# Patient Record
Sex: Male | Born: 1960 | Race: Black or African American | Hispanic: No | Smoking: Never smoker
Health system: Southern US, Community
[De-identification: ages and names within clinical notes are randomized; demographics above are authoritative.]

## PROBLEM LIST (undated history)

## (undated) DIAGNOSIS — E119 Type 2 diabetes mellitus without complications: Secondary | ICD-10-CM

## (undated) DIAGNOSIS — I10 Essential (primary) hypertension: Secondary | ICD-10-CM

---

## 2000-05-01 ENCOUNTER — Encounter: Admission: RE | Admit: 2000-05-01 | Discharge: 2000-07-30 | Payer: Self-pay | Admitting: Endocrinology

## 2010-02-20 ENCOUNTER — Observation Stay (HOSPITAL_COMMUNITY): Admission: EM | Admit: 2010-02-20 | Discharge: 2010-02-21 | Payer: Self-pay | Admitting: Emergency Medicine

## 2010-12-06 LAB — BASIC METABOLIC PANEL
BUN: 14 mg/dL (ref 6–23)
Calcium: 9.2 mg/dL (ref 8.4–10.5)
Creatinine, Ser: 1.25 mg/dL (ref 0.4–1.5)
GFR calc Af Amer: >60 mL/min (ref >60)
Glucose, Bld: 47 mg/dL — ABNORMAL LOW (ref 70–99)
Potassium: 3.3 mEq/L — ABNORMAL LOW (ref 3.5–5.1)

## 2010-12-06 LAB — GLUCOSE, CAPILLARY
Glucose-Capillary: 268 mg/dL — ABNORMAL HIGH (ref 70–99)
Glucose-Capillary: 90 mg/dL (ref 70–99)

## 2010-12-06 LAB — CBC
Hemoglobin: 11.5 g/dL — ABNORMAL LOW (ref 13.0–17.0)
Hemoglobin: 15.3 g/dL (ref 13.0–17.0)
MCHC: 33.5 g/dL (ref 30.0–36.0)
MCHC: 34 g/dL (ref 30.0–36.0)
MCV: 88.2 fL (ref 78.0–100.0)
MCV: 88.9 fL (ref 78.0–100.0)
Platelets: 161 10*3/uL (ref 150–400)
Platelets: 228 10*3/uL (ref 150–400)
RBC: 3.87 MIL/uL — ABNORMAL LOW (ref 4.22–5.81)
RDW: 12.7 % (ref 11.5–15.5)
RDW: 12.9 % (ref 11.5–15.5)

## 2010-12-06 LAB — DIFFERENTIAL
Basophils Relative: 0 % (ref 0–1)
Lymphs Abs: 0.8 10*3/uL (ref 0.7–4.0)
Monocytes Absolute: 0.8 10*3/uL (ref 0.1–1.0)
Monocytes Relative: 6 % (ref 3–12)
Neutrophils Relative %: 87 % — ABNORMAL HIGH (ref 43–77)

## 2010-12-06 LAB — POCT I-STAT GLUCOSE: Operator id: 146111

## 2014-12-17 ENCOUNTER — Other Ambulatory Visit: Payer: Self-pay | Admitting: Internal Medicine

## 2014-12-17 DIAGNOSIS — M25512 Pain in left shoulder: Secondary | ICD-10-CM

## 2015-01-02 ENCOUNTER — Ambulatory Visit
Admission: RE | Admit: 2015-01-02 | Discharge: 2015-01-02 | Disposition: A | Discharge status: Home / Self Care | Payer: BLUE CROSS/BLUE SHIELD | Source: Ambulatory Visit | Attending: Internal Medicine | Admitting: Internal Medicine

## 2015-01-02 DIAGNOSIS — M25512 Pain in left shoulder: Secondary | ICD-10-CM

## 2016-02-25 ENCOUNTER — Emergency Department (HOSPITAL_COMMUNITY)
Admission: EM | Admit: 2016-02-25 | Discharge: 2016-02-25 | Disposition: A | Discharge status: Home / Self Care | Payer: No Typology Code available for payment source | Attending: Emergency Medicine | Admitting: Emergency Medicine

## 2016-02-25 ENCOUNTER — Emergency Department (HOSPITAL_COMMUNITY): Payer: No Typology Code available for payment source

## 2016-02-25 ENCOUNTER — Encounter (HOSPITAL_COMMUNITY): Payer: Self-pay | Admitting: *Deleted

## 2016-02-25 DIAGNOSIS — I10 Essential (primary) hypertension: Secondary | ICD-10-CM | POA: Insufficient documentation

## 2016-02-25 DIAGNOSIS — Z23 Encounter for immunization: Secondary | ICD-10-CM | POA: Diagnosis not present

## 2016-02-25 DIAGNOSIS — S80212A Abrasion, left knee, initial encounter: Secondary | ICD-10-CM | POA: Diagnosis not present

## 2016-02-25 DIAGNOSIS — S2220XA Unspecified fracture of sternum, initial encounter for closed fracture: Secondary | ICD-10-CM | POA: Insufficient documentation

## 2016-02-25 DIAGNOSIS — Y999 Unspecified external cause status: Secondary | ICD-10-CM | POA: Insufficient documentation

## 2016-02-25 DIAGNOSIS — E119 Type 2 diabetes mellitus without complications: Secondary | ICD-10-CM | POA: Diagnosis not present

## 2016-02-25 DIAGNOSIS — Y92410 Unspecified street and highway as the place of occurrence of the external cause: Secondary | ICD-10-CM | POA: Diagnosis not present

## 2016-02-25 DIAGNOSIS — R079 Chest pain, unspecified: Secondary | ICD-10-CM | POA: Diagnosis present

## 2016-02-25 DIAGNOSIS — Y939 Activity, unspecified: Secondary | ICD-10-CM | POA: Diagnosis not present

## 2016-02-25 DIAGNOSIS — S60812A Abrasion of left wrist, initial encounter: Secondary | ICD-10-CM | POA: Insufficient documentation

## 2016-02-25 DIAGNOSIS — S80211A Abrasion, right knee, initial encounter: Secondary | ICD-10-CM | POA: Diagnosis not present

## 2016-02-25 HISTORY — DX: Essential (primary) hypertension: I10

## 2016-02-25 HISTORY — DX: Type 2 diabetes mellitus without complications: E11.9

## 2016-02-25 LAB — I-STAT CHEM 8, ED
BUN: 19 mg/dL (ref 6–20)
CALCIUM ION: 1.11 mmol/L — AB (ref 1.12–1.23)
CHLORIDE: 103 mmol/L (ref 101–111)
Creatinine, Ser: 1 mg/dL (ref 0.61–1.24)
GLUCOSE: 120 mg/dL — AB (ref 65–99)
HCT: 48 % (ref 39.0–52.0)
HEMOGLOBIN: 16.3 g/dL (ref 13.0–17.0)
POTASSIUM: 3.7 mmol/L (ref 3.5–5.1)
SODIUM: 141 mmol/L (ref 135–145)
TCO2: 28 mmol/L (ref 0–100)

## 2016-02-25 LAB — I-STAT TROPONIN, ED: TROPONIN I, POC: 0 ng/mL (ref 0.00–0.08)

## 2016-02-25 LAB — CBG MONITORING, ED: GLUCOSE-CAPILLARY: 169 mg/dL — AB (ref 65–99)

## 2016-02-25 MED ORDER — MORPHINE SULFATE (PF) 4 MG/ML IV SOLN
6.0000 mg | Freq: Once | INTRAVENOUS | Status: AC
  Administered 2016-02-25: 6 mg via INTRAMUSCULAR
  Filled 2016-02-25: qty 2

## 2016-02-25 MED ORDER — ONDANSETRON 4 MG PO TBDP: 4.0000 mg | ORAL_TABLET | Freq: Three times a day (TID) | ORAL | Status: AC | PRN

## 2016-02-25 MED ORDER — OXYCODONE-ACETAMINOPHEN 5-325 MG PO TABS: 1.0000 | ORAL_TABLET | Freq: Four times a day (QID) | ORAL | Status: AC | PRN

## 2016-02-25 MED ORDER — DOCUSATE SODIUM 100 MG PO CAPS
100.0000 mg | ORAL_CAPSULE | Freq: Two times a day (BID) | ORAL | Status: AC
Start: 2016-02-25 — End: ?

## 2016-02-25 MED ORDER — ONDANSETRON 4 MG PO TBDP
4.0000 mg | ORAL_TABLET | Freq: Once | ORAL | Status: AC
Start: 2016-02-25 — End: 2016-02-25
  Administered 2016-02-25: 4 mg via ORAL
  Filled 2016-02-25: qty 1

## 2016-02-25 MED ORDER — IOPAMIDOL (ISOVUE-300) INJECTION 61%
INTRAVENOUS | Status: AC
  Administered 2016-02-25: 75 mL
  Filled 2016-02-25: qty 100

## 2016-02-25 MED ORDER — TETANUS-DIPHTH-ACELL PERTUSSIS 5-2.5-18.5 LF-MCG/0.5 IM SUSP
0.5000 mL | Freq: Once | INTRAMUSCULAR | Status: AC
  Administered 2016-02-25: 0.5 mL via INTRAMUSCULAR
  Filled 2016-02-25: qty 0.5
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
## 2016-02-25 NOTE — ED Notes (Signed)
Pt taken to CT.

## 2016-02-25 NOTE — ED Notes (Signed)
Pt taken to xray 
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
## 2019-04-10 ENCOUNTER — Telehealth (HOSPITAL_COMMUNITY): Payer: Self-pay

## 2019-04-10 ENCOUNTER — Encounter (HOSPITAL_COMMUNITY): Payer: Self-pay

## 2019-04-10 ENCOUNTER — Ambulatory Visit (HOSPITAL_COMMUNITY)
Admission: EM | Admit: 2019-04-10 | Discharge: 2019-04-10 | Disposition: A | Discharge status: Home / Self Care | Payer: Self-pay

## 2019-04-10 ENCOUNTER — Telehealth: Payer: Self-pay | Admitting: Emergency Medicine

## 2019-04-10 ENCOUNTER — Other Ambulatory Visit: Payer: Self-pay

## 2019-04-10 DIAGNOSIS — S7012XA Contusion of left thigh, initial encounter: Secondary | ICD-10-CM | POA: Insufficient documentation

## 2019-04-10 DIAGNOSIS — I10 Essential (primary) hypertension: Secondary | ICD-10-CM

## 2019-04-10 LAB — COMPREHENSIVE METABOLIC PANEL
ALT: 33 U/L (ref 0–44)
AST: 27 U/L (ref 15–41)
Albumin: 3.9 g/dL (ref 3.5–5.0)
Alkaline Phosphatase: 85 U/L (ref 38–126)
Anion gap: 7 (ref 5–15)
BUN: 12 mg/dL (ref 6–20)
CO2: 28 mmol/L (ref 22–32)
Calcium: 8.9 mg/dL (ref 8.9–10.3)
Chloride: 109 mmol/L (ref 98–111)
Creatinine, Ser: 1.07 mg/dL (ref 0.61–1.24)
GFR calc Af Amer: >60 mL/min (ref >60)
GFR calc non Af Amer: >60 mL/min (ref >60)
Glucose, Bld: 43 mg/dL — CL (ref 70–99)
Potassium: 3.5 mmol/L (ref 3.5–5.1)
Sodium: 144 mmol/L (ref 135–145)
Total Bilirubin: 0.7 mg/dL (ref 0.3–1.2)
Total Protein: 7.2 g/dL (ref 6.5–8.1)

## 2019-04-10 LAB — CBC
HCT: 47.6 % (ref 39.0–52.0)
Hemoglobin: 15.1 g/dL (ref 13.0–17.0)
MCH: 28.3 pg (ref 26.0–34.0)
MCHC: 31.7 g/dL (ref 30.0–36.0)
MCV: 89.3 fL (ref 80.0–100.0)
Platelets: 272 10*3/uL (ref 150–400)
RBC: 5.33 MIL/uL (ref 4.22–5.81)
RDW: 13.2 % (ref 11.5–15.5)
WBC: 8.2 10*3/uL (ref 4.0–10.5)
nRBC: 0 % (ref 0.0–0.2)

## 2019-04-10 NOTE — Telephone Encounter (Signed)
Office attempted to reach patient x3 today about low glucose level, I also attempted to reach patietn and left vm. Will try to reach patient again tomorrw
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
## 2019-04-10 NOTE — Discharge Instructions (Signed)
Use ice as discussed at appointment. Do not take any NSAIDs (Motrin, ibuprofen, Aleve, naproxen, BC powders) or aspirin. We will call you with your lab results if abnormal. To ER for further evaluation if you develop leg numbness, swelling, redness, abdominal or chest pain, difficulty breathing, lightheadedness.

## 2019-04-10 NOTE — Telephone Encounter (Signed)
Patient returning call.  Relayed lab results: glucose at critical low of 43.  CBC unremarkable, kidney liver function within normal limits.  Patient states that he has eaten since discharge.  Had a granola bar in the car.  Maintains that he was asymptomatic during visit, is feeling well currently.  Has not had taken insulin today, and will monitor glucose.  Patient has 8/5 appointment with PCP, encouraged him to bring up hypoglycemic reading at that time.  Return precautions discussed, patient verbalized understanding and is agreeable to plan.

## 2019-04-10 NOTE — ED Triage Notes (Signed)
Patient presents to Urgent Care with complaints of large area of bruising to left upper thigh since 2 days ago. Patient reports no known injury, is not on blood thinners.

## 2023-11-22 ENCOUNTER — Emergency Department (HOSPITAL_COMMUNITY)

## 2023-11-22 ENCOUNTER — Emergency Department (HOSPITAL_COMMUNITY)
Admission: EM | Admit: 2023-11-22 | Discharge: 2023-11-22 | Disposition: A | Discharge status: Home / Self Care | Attending: Emergency Medicine | Admitting: Emergency Medicine

## 2023-11-22 DIAGNOSIS — R270 Ataxia, unspecified: Secondary | ICD-10-CM | POA: Diagnosis not present

## 2023-11-22 DIAGNOSIS — Z794 Long term (current) use of insulin: Secondary | ICD-10-CM | POA: Insufficient documentation

## 2023-11-22 DIAGNOSIS — R4701 Aphasia: Secondary | ICD-10-CM

## 2023-11-22 DIAGNOSIS — E119 Type 2 diabetes mellitus without complications: Secondary | ICD-10-CM | POA: Insufficient documentation

## 2023-11-22 DIAGNOSIS — R4781 Slurred speech: Secondary | ICD-10-CM | POA: Diagnosis present

## 2023-11-22 DIAGNOSIS — F10120 Alcohol abuse with intoxication, uncomplicated: Secondary | ICD-10-CM | POA: Diagnosis not present

## 2023-11-22 DIAGNOSIS — F10920 Alcohol use, unspecified with intoxication, uncomplicated: Secondary | ICD-10-CM | POA: Diagnosis not present

## 2023-11-22 LAB — RAPID URINE DRUG SCREEN, HOSP PERFORMED
Amphetamines: NOT DETECTED
Barbiturates: NOT DETECTED
Benzodiazepines: NOT DETECTED
Cocaine: NOT DETECTED
Opiates: NOT DETECTED
Tetrahydrocannabinol: NOT DETECTED

## 2023-11-22 LAB — CBC
HCT: 41.8 % (ref 39.0–52.0)
Hemoglobin: 13.4 g/dL (ref 13.0–17.0)
MCH: 29.1 pg (ref 26.0–34.0)
MCHC: 32.1 g/dL (ref 30.0–36.0)
MCV: 90.9 fL (ref 80.0–100.0)
Platelets: 230 10*3/uL (ref 150–400)
RBC: 4.6 MIL/uL (ref 4.22–5.81)
RDW: 13.6 % (ref 11.5–15.5)
WBC: 6 10*3/uL (ref 4.0–10.5)
nRBC: 0 % (ref 0.0–0.2)

## 2023-11-22 LAB — COMPREHENSIVE METABOLIC PANEL
ALT: 33 U/L (ref 0–44)
AST: 32 U/L (ref 15–41)
Albumin: 3.4 g/dL — ABNORMAL LOW (ref 3.5–5.0)
Alkaline Phosphatase: 56 U/L (ref 38–126)
Anion gap: 13 (ref 5–15)
BUN: 15 mg/dL (ref 8–23)
CO2: 25 mmol/L (ref 22–32)
Calcium: 9.1 mg/dL (ref 8.9–10.3)
Chloride: 99 mmol/L (ref 98–111)
Creatinine, Ser: 1.27 mg/dL — ABNORMAL HIGH (ref 0.61–1.24)
GFR, Estimated: >60 mL/min (ref >60)
Glucose, Bld: 371 mg/dL — ABNORMAL HIGH (ref 70–99)
Potassium: 3.9 mmol/L (ref 3.5–5.1)
Sodium: 137 mmol/L (ref 135–145)
Total Bilirubin: 0.5 mg/dL (ref 0.0–1.2)
Total Protein: 6.4 g/dL — ABNORMAL LOW (ref 6.5–8.1)

## 2023-11-22 LAB — I-STAT CHEM 8, ED
BUN: 16 mg/dL (ref 8–23)
Calcium, Ion: 1.09 mmol/L — ABNORMAL LOW (ref 1.15–1.40)
Chloride: 101 mmol/L (ref 98–111)
Creatinine, Ser: 1.5 mg/dL — ABNORMAL HIGH (ref 0.61–1.24)
Glucose, Bld: 357 mg/dL — ABNORMAL HIGH (ref 70–99)
HCT: 44 % (ref 39.0–52.0)
Hemoglobin: 15 g/dL (ref 13.0–17.0)
Potassium: 3.9 mmol/L (ref 3.5–5.1)
Sodium: 137 mmol/L (ref 135–145)
TCO2: 27 mmol/L (ref 22–32)

## 2023-11-22 LAB — I-STAT VENOUS BLOOD GAS, ED
Acid-Base Excess: 3 mmol/L — ABNORMAL HIGH (ref 0.0–2.0)
Bicarbonate: 27.5 mmol/L (ref 20.0–28.0)
Calcium, Ion: 1.03 mmol/L — ABNORMAL LOW (ref 1.15–1.40)
HCT: 43 % (ref 39.0–52.0)
Hemoglobin: 14.6 g/dL (ref 13.0–17.0)
O2 Saturation: 100 %
Potassium: 5.5 mmol/L — ABNORMAL HIGH (ref 3.5–5.1)
Sodium: 137 mmol/L (ref 135–145)
TCO2: 29 mmol/L (ref 22–32)
pCO2, Ven: 41.2 mmHg — ABNORMAL LOW (ref 44–60)
pH, Ven: 7.433 — ABNORMAL HIGH (ref 7.25–7.43)
pO2, Ven: 164 mmHg — ABNORMAL HIGH (ref 32–45)

## 2023-11-22 LAB — URINALYSIS, ROUTINE W REFLEX MICROSCOPIC
Bacteria, UA: NONE SEEN
Bilirubin Urine: NEGATIVE
Glucose, UA: >=500 mg/dL — AB
Hgb urine dipstick: NEGATIVE
Ketones, ur: NEGATIVE mg/dL
Leukocytes,Ua: NEGATIVE
Nitrite: NEGATIVE
Protein, ur: NEGATIVE mg/dL
Specific Gravity, Urine: 1.013 (ref 1.005–1.030)
pH: 5 (ref 5.0–8.0)

## 2023-11-22 LAB — DIFFERENTIAL
Abs Immature Granulocytes: 0.01 10*3/uL (ref 0.00–0.07)
Basophils Absolute: 0 10*3/uL (ref 0.0–0.1)
Basophils Relative: 1 %
Eosinophils Absolute: 0.1 10*3/uL (ref 0.0–0.5)
Eosinophils Relative: 2 %
Immature Granulocytes: 0 %
Lymphocytes Relative: 34 %
Lymphs Abs: 2 10*3/uL (ref 0.7–4.0)
Monocytes Absolute: 0.9 10*3/uL (ref 0.1–1.0)
Monocytes Relative: 15 %
Neutro Abs: 2.9 10*3/uL (ref 1.7–7.7)
Neutrophils Relative %: 48 %

## 2023-11-22 LAB — CBG MONITORING, ED: Glucose-Capillary: 355 mg/dL — ABNORMAL HIGH (ref 70–99)

## 2023-11-22 LAB — AMMONIA: Ammonia: 65 umol/L — ABNORMAL HIGH (ref 9–35)

## 2023-11-22 LAB — I-STAT CG4 LACTIC ACID, ED: Lactic Acid, Venous: 2.2 mmol/L (ref 0.5–1.9)

## 2023-11-22 LAB — ETHANOL: Alcohol, Ethyl (B): 276 mg/dL — ABNORMAL HIGH (ref <10)

## 2023-11-22 LAB — HEPARIN LEVEL (UNFRACTIONATED): Heparin Unfractionated: <0.1 [IU]/mL — ABNORMAL LOW (ref 0.30–0.70)

## 2023-11-22 LAB — PROTIME-INR
INR: 0.9 (ref 0.8–1.2)
Prothrombin Time: 12.8 s (ref 11.4–15.2)

## 2023-11-22 LAB — APTT: aPTT: 28 s (ref 24–36)

## 2023-11-22 NOTE — Code Documentation (Signed)
 Responded to Code Stroke called at 0018 for slurred speech and abnormal gait, [id number]. Pt arrived at 0024, [id number], NIH-4 for ataxia, aphasia, and slurred speech. CT head negative for acute changes. TNK not given-risks outweigh benefit. Pt transported to MRI at 0106. MRI-negative for stroke. Code Stroke cancelled at 0154.

## 2023-11-22 NOTE — Consult Note (Signed)
 NEUROLOGY CONSULT NOTE   Date of service: November 22, 2023 Patient Name: Logan Mack MRN:  [id number] DOB:  [birth year] Chief Complaint: Abnormal speech and unsteady gait Requesting Provider: Arletha Pili, DO  History of Present Illness  Logan Mack is a 63 y.o. male with a PMHx of regular EtOH use, DM and HTN who is brought in by EMS as a Code Stroke after his wife noted him at work to have slurred speech, difficulty ambulating and garbled speech. LKN was 2030. He reported having "2 margaritas" tonight. CBG was 355 on arrival to the ED.   LKW: 2030 Modified rankin score: 0-Completely asymptomatic and back to baseline post- stroke IV Thrombolysis:  No: Presentation is most consistent with EtOH intoxication EVT: No:  Presentation is most consistent with EtOH intoxication   NIHSS components Score: Comment  1a Level of Conscious 0[x]  1[]  2[]  3[]      1b LOC Questions 0[x]  1[]  2[]       1c LOC Commands 0[x]  1[]  2[]       2 Best Gaze 0[x]  1[]  2[]       3 Visual 0[x]  1[]  2[]  3[]      4 Facial Palsy 0[x]  1[]  2[]  3[]      5a Motor Arm - left 0[x]  1[]  2[]  3[]  4[]  UN[]    5b Motor Arm - Right 0[x]  1[]  2[]  3[]  4[]  UN[]    6a Motor Leg - Left 0[x]  1[]  2[]  3[]  4[]  UN[]    6b Motor Leg - Right 0[x]  1[]  2[]  3[]  4[]  UN[]    7 Limb Ataxia 0[]  1[]  2[x]  3[]  UN[]     8 Sensory 0[x]  1[]  2[]  UN[]      9 Best Language 0[]  1[x]  2[]  3[]      10 Dysarthria 0[]  1[x]  2[]  UN[]      11 Extinct. and Inattention 0[x]  1[]  2[]       TOTAL:   4      ROS  Detailed ROS deferred in the context of acuity of presentation.     Past History   Past Medical History:  Diagnosis Date   Diabetes mellitus without complication (HCC)    Hypertension     No past surgical history on file.  Family History: Family History  Problem Relation Age of Onset   Cancer Mother    Diabetes Father     Social History  reports that he has never smoked. He has never used smokeless tobacco. He reports current alcohol use. No history  on file for drug use.  No Known Allergies  Medications  No current facility-administered medications for this encounter.  Current Outpatient Medications:    docusate sodium (COLACE) 100 MG capsule, Take 1 capsule (100 mg total) by mouth every 12 (twelve) hours., Disp: 60 capsule, Rfl: 0   insulin glargine (LANTUS) 100 UNIT/ML injection, Inject into the skin daily., Disp: , Rfl:    ondansetron (ZOFRAN ODT) 4 MG disintegrating tablet, Take 1 tablet (4 mg total) by mouth every 8 (eight) hours as needed for nausea or vomiting., Disp: 20 tablet, Rfl: 0   oxyCODONE-acetaminophen (PERCOCET/ROXICET) 5-325 MG tablet, Take 1-2 tablets by mouth every 6 (six) hours as needed., Disp: 30 tablet, Rfl: 0  Vitals  There were no vitals filed for this visit.  There is no height or weight on file to calculate BMI.  Physical Exam   Physical Exam  HEENT-  [place]/AT    Lungs- Respirations unlabored Extremities- No edema  Neurological Examination Mental Status: Alert, oriented x 5, but with confusion as evidenced by replies to complex  questions and responses to complex commands.  Speech is with a stuttering quality with difficulty articulating simple ideas, but no definite errors of grammar or syntax and no paraphasias.  Can follow all simple commands and answer basic questions, but becomes confused with more challenging tasks.  Cranial Nerves: II: Temporal visual fields intact with no extinction to DSS. Pupils 6 mm and briskly reactive bilaterally  III,IV, VI: No ptosis. EOMI.  V: Temp sensation equal bilaterally  VII: Smile symmetric VIII: Hearing intact to voice IX,X: No hoarseness XI: Symmetric shoulder shrug XII: Midline tongue extension Motor: BUE 5/5 proximally and distally BLE 5/5 proximally and distally  No pronator drift.  Sensory: Temp and light touch intact throughout, bilaterally. No extinction to DSS.  Deep Tendon Reflexes: 2+ and symmetric throughout Cerebellar: Mild ataxia with FNF  bilaterally. Moderate ataxia with H-S bilaterally.  Gait: Wide-based, ataxic gait.   Labs/Imaging/Neurodiagnostic studies   CBC: No results for input(s): "WBC", "NEUTROABS", "HGB", "HCT", "MCV", "PLT" in the last 168 hours. Basic Metabolic Panel:  Lab Results  Component Value Date   NA 144 04/10/2019   K 3.5 04/10/2019   CO2 28 04/10/2019   GLUCOSE 43 (LL) 04/10/2019   BUN 12 04/10/2019   CREATININE 1.07 04/10/2019   CALCIUM 8.9 04/10/2019   GFRNONAA >60 04/10/2019   GFRAA >60 04/10/2019   Lipid Panel: No results found for: "LDLCALC" HgbA1c: No results found for: "HGBA1C" Urine Drug Screen: No results found for: "LABOPIA", "COCAINSCRNUR", "LABBENZ", "AMPHETMU", "THCU", "LABBARB"  Alcohol Level No results found for: "ETH" INR  Lab Results  Component Value Date   INR 1.12 02/21/2010     ASSESSMENT  63 y.o. male with a PMHx of regular EtOH use, DM and HTN who is brought in by EMS as a Code Stroke after his wife noted him at work to have slurred speech, difficulty ambulating and garbled speech. LKN was 2030. He reported having "2 margaritas" tonight. CBG was 355 on arrival to the ED.  - Exam reveals findings suggestive of EtOH intoxication. His speech pattern is dysphasic, but not typical of stroke, more consistent with intoxication.  - CT head: No evidence of acute intracranial abnormality. ASPECTS is 10.  - Labs: - Glucose 355 - EtOH - Ammonia elevated at 65. AST and ALT are normal.  - BUN 15 and Cr 1.27 - Lactate 2.2 - WBC normal - UDS negative - EtOH level significantly elevated at 276 - Obtaining a STAT MRI brain to fully rule out an acute stroke  RECOMMENDATIONS  - MRI brain - Frequent neuro checks  Addendum: MRI brain: No evidence of acute intracranial abnormality.  ______________________________________________________________________    Dessa Phi, Ashlee Bewley, MD [facility]

## 2023-11-22 NOTE — Discharge Instructions (Addendum)
 You may take your insulin when you return home.  While you were in the emergency room, you had scans done of your head that were normal.  Your alcohol level was elevated, 269.  This correlates to a 0.269 on a breathalyzer.  Your ammonia, which can sometimes be elevated due to alcohol use, was elevated.  Make sure that you are drinking plenty of water over the next few days, and follow-up with your primary care doctor within 1 week.

## 2023-11-22 NOTE — ED Provider Notes (Addendum)
 [facility] EM[facility]MENT AT [facility] Provider Note   CSN: [id number] Arrival date & time: 11/22/23  4098     History  No chief complaint on file.   Logan Mack is a 63 y.o. male.  This is a 63 year old male who presents to the emergency room today due to slurred speech, balance issues.  Patient has history of diabetes, takes insulin.  Does endorse consuming alcohol this evening.  Patient was activated as a code stroke from the field.        Home Medications Prior to Admission medications   Medication Sig Start Date End Date Taking? Authorizing Provider  docusate sodium (COLACE) 100 MG capsule Take 1 capsule (100 mg total) by mouth every 12 (twelve) hours. 02/25/16   Ward, Layla Maw, DO  insulin glargine (LANTUS) 100 UNIT/ML injection Inject into the skin daily.    [provider]  ondansetron (ZOFRAN ODT) 4 MG disintegrating tablet Take 1 tablet (4 mg total) by mouth every 8 (eight) hours as needed for nausea or vomiting. 02/25/16   Ward, Layla Maw, DO  oxyCODONE-acetaminophen (PERCOCET/ROXICET) 5-325 MG tablet Take 1-2 tablets by mouth every 6 (six) hours as needed. 02/25/16   Ward, Layla Maw, DO      Allergies    Patient has no known allergies.    Review of Systems   Review of Systems  Physical Exam Updated Vital Signs There were no vitals taken for this visit. Physical Exam Vitals reviewed.  HENT:     Mouth/Throat:     Mouth: Mucous membranes are moist.  Cardiovascular:     Rate and Rhythm: Normal rate.  Pulmonary:     Effort: Pulmonary effort is normal.  Abdominal:     General: Abdomen is flat.     Palpations: Abdomen is soft.  Musculoskeletal:        General: Normal range of motion.  Neurological:     Mental Status: He is alert.     Cranial Nerves: No cranial nerve deficit.     Comments: Patient appears of some word finding difficulty.  He has no weakness     ED Results / Procedures / Treatments   Labs (all labs ordered are  listed, but only abnormal results are displayed) Labs Reviewed  CBG MONITORING, ED - Abnormal; Notable for the following components:      Result Value   Glucose-Capillary 355 (*)    All other components within normal limits  ETHANOL  PROTIME-INR  APTT  CBC  DIFFERENTIAL  COMPREHENSIVE METABOLIC PANEL  RAPID URINE DRUG SCREEN, HOSP PERFORMED  URINALYSIS, ROUTINE W REFLEX MICROSCOPIC  I-STAT CHEM 8, ED    EKG None  Radiology No results found.  Procedures Procedures    Medications Ordered in ED Medications - No data to display  ED Course/ Medical Decision Making/ A&P                                 Medical Decision Making 63 year old male here today for dizziness and slurred speech.  Plan-patient activated as a code stroke on arrival, went to CT scan.  Upon initial the patient, did seem intoxicated, and his alcohol did come back up to 76.  Abdomen soft, no ascites.  No asterixis.  Neurology recommending MRI.  Reassessment 3 AM-patient hyperglycemic without any acidosis.  Will have him take his insulin when he gets home.  He has a sober ride to take him  home.  Patient's ammonia elevated at 65.  Went to reassess the patient, he has no asterixis, speech considerably improved, ambulated without any difficulty.  Patient not showing any signs nor symptoms of hepatic encephalopathy.  No elevation in LFTs, normal coags.  Will discharge patient have him follow-up with his PCP.  I independently reviewed the patient's head CT, no intracranial hemorrhage.  Counseled the patient on alcohol reduction.  Amount and/or Complexity of Data Reviewed Labs: ordered. Radiology: ordered.           Final Clinical Impression(s) / ED Diagnoses Final diagnoses:  None    Rx / DC Orders ED Discharge Orders     None         Anders Simmonds T, DO 11/22/23 0308    Anders Simmonds T, DO 11/22/23 [phone]

## 2023-11-22 NOTE — ED Notes (Signed)
 Pt gave verbal permission for this RN to remove the Dexcom.

## 2023-11-22 NOTE — ED Triage Notes (Signed)
 Pt BIB [facility] from home. Per wife pt has slurred speech, difficulty ambulating and aphasic. LSN 2030. Pt reports having 2 margaritas tonight.
